# Patient Record
Sex: Female | Born: 1990 | Race: Black or African American | Hispanic: No | Marital: Single | Smoking: Never smoker
Health system: Southern US, Community
[De-identification: ages and names within clinical notes are randomized; demographics above are authoritative.]

## PROBLEM LIST (undated history)

## (undated) DIAGNOSIS — S060X9A Concussion with loss of consciousness of unspecified duration, initial encounter: Secondary | ICD-10-CM

## (undated) DIAGNOSIS — S060XAA Concussion with loss of consciousness status unknown, initial encounter: Secondary | ICD-10-CM

## (undated) DIAGNOSIS — R011 Cardiac murmur, unspecified: Secondary | ICD-10-CM

## (undated) HISTORY — PX: KNEE SURGERY: SHX244

---
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
## 2010-09-19 ENCOUNTER — Ambulatory Visit: Payer: Self-pay | Admitting: Family Medicine

## 2011-07-16 ENCOUNTER — Emergency Department (HOSPITAL_COMMUNITY)
Admission: EM | Admit: 2011-07-16 | Discharge: 2011-07-16 | Disposition: A | Discharge status: Home / Self Care | Payer: BC Managed Care – PPO | Attending: Emergency Medicine | Admitting: Emergency Medicine

## 2011-07-16 ENCOUNTER — Emergency Department (HOSPITAL_COMMUNITY): Payer: BC Managed Care – PPO

## 2011-07-16 DIAGNOSIS — X58XXXA Exposure to other specified factors, initial encounter: Secondary | ICD-10-CM | POA: Insufficient documentation

## 2011-07-16 DIAGNOSIS — M25539 Pain in unspecified wrist: Secondary | ICD-10-CM | POA: Insufficient documentation

## 2011-07-16 DIAGNOSIS — S63509A Unspecified sprain of unspecified wrist, initial encounter: Secondary | ICD-10-CM | POA: Insufficient documentation

## 2011-11-23 ENCOUNTER — Emergency Department: Payer: Self-pay | Admitting: Emergency Medicine

## 2012-10-09 ENCOUNTER — Ambulatory Visit: Payer: Self-pay | Admitting: General Practice

## 2013-01-21 ENCOUNTER — Telehealth (HOSPITAL_COMMUNITY): Payer: Self-pay | Admitting: Emergency Medicine

## 2013-01-21 ENCOUNTER — Encounter (HOSPITAL_COMMUNITY): Payer: Self-pay | Admitting: *Deleted

## 2013-01-21 ENCOUNTER — Emergency Department (HOSPITAL_COMMUNITY)
Admission: EM | Admit: 2013-01-21 | Discharge: 2013-01-21 | Disposition: A | Discharge status: Home / Self Care | Payer: BC Managed Care – PPO | Attending: Emergency Medicine | Admitting: Emergency Medicine

## 2013-01-21 DIAGNOSIS — R011 Cardiac murmur, unspecified: Secondary | ICD-10-CM | POA: Insufficient documentation

## 2013-01-21 DIAGNOSIS — R112 Nausea with vomiting, unspecified: Secondary | ICD-10-CM | POA: Insufficient documentation

## 2013-01-21 DIAGNOSIS — R197 Diarrhea, unspecified: Secondary | ICD-10-CM | POA: Insufficient documentation

## 2013-01-21 DIAGNOSIS — B9789 Other viral agents as the cause of diseases classified elsewhere: Secondary | ICD-10-CM | POA: Insufficient documentation

## 2013-01-21 DIAGNOSIS — Z87828 Personal history of other (healed) physical injury and trauma: Secondary | ICD-10-CM | POA: Insufficient documentation

## 2013-01-21 HISTORY — DX: Concussion with loss of consciousness status unknown, initial encounter: S06.0XAA

## 2013-01-21 HISTORY — DX: Concussion with loss of consciousness of unspecified duration, initial encounter: S06.0X9A

## 2013-01-21 HISTORY — DX: Cardiac murmur, unspecified: R01.1

## 2013-01-21 LAB — POCT I-STAT, CHEM 8
BUN: 15 mg/dL (ref 6–23)
Creatinine, Ser: 1 mg/dL (ref 0.50–1.10)
Hemoglobin: 15.6 g/dL — ABNORMAL HIGH (ref 12.0–15.0)
Potassium: 4.2 mEq/L (ref 3.5–5.1)
Sodium: 141 mEq/L (ref 135–145)

## 2013-01-21 MED ORDER — LOPERAMIDE HCL 2 MG PO CAPS
4.0000 mg | ORAL_CAPSULE | Freq: Once | ORAL | Status: AC
Start: 2013-01-21 — End: 2013-01-21
  Administered 2013-01-21: 4 mg via ORAL
  Filled 2013-01-21: qty 2

## 2013-01-21 MED ORDER — ONDANSETRON 8 MG PO TBDP: ORAL_TABLET | ORAL | Status: DC

## 2013-01-21 MED ORDER — ONDANSETRON HCL 4 MG/2ML IJ SOLN
4.0000 mg | Freq: Once | INTRAMUSCULAR | Status: AC
  Administered 2013-01-21: 4 mg via INTRAVENOUS
  Filled 2013-01-21: qty 2

## 2013-01-21 MED ORDER — SODIUM CHLORIDE 0.9 % IV BOLUS (SEPSIS)
1000.0000 mL | Freq: Once | INTRAVENOUS | Status: AC
  Administered 2013-01-21: 1000 mL via INTRAVENOUS

## 2013-01-21 MED ORDER — LOPERAMIDE HCL 2 MG PO CAPS: 2.0000 mg | ORAL_CAPSULE | Freq: Four times a day (QID) | ORAL | Status: DC | PRN

## 2013-01-21 NOTE — ED Notes (Signed)
Pt reports n/v/d that began yesterday evening approx 2000. Pt reports vomiting is constant and multiple episodes of watery diarrhea - pt denies any known fever or recent sick contacts.

## 2013-01-21 NOTE — ED Provider Notes (Signed)
Medical screening examination/treatment/procedure(s) were performed by non-physician practitioner and as supervising physician I was immediately available for consultation/collaboration.  John-Adam Gionni Freese, M.D.     John-Adam Shellee Streng, MD 01/21/13 0709 

## 2013-01-21 NOTE — ED Notes (Signed)
Pharmacy calling.  Rx for Ondansetron too expensive would like changed to something cheaper.  Will take chart to MD for review.

## 2013-01-21 NOTE — ED Provider Notes (Signed)
History     CSN: [id number]  Arrival date & time 01/21/13  0449   First MD Initiated Contact with Patient 01/21/13 0503      Chief Complaint  Patient presents with  . Nausea  . Emesis  . Diarrhea   HPI  History provided by the patient. Patient is a 22 year old female with no significant PMH who presents with complaints of acute onset nausea vomiting and diarrhea. Patient states that she thinks it may be from eating red meats earlier yesterday morning. In the past when she eats large meals with red meats she has stomach irritation with nausea. This evening however she developed acute onset nausea vomiting and diarrhea which has been much different than any previous experiences with red meats. She believes the meat was well cooked and not spoiled. She felt well during most of the day and was at the gym in the evening when she returned home around 8 PM her symptoms began. She reports multiple episodes of vomiting associated with watery diarrhea occuring at the same time. She denies having blood or mucus in stool. Denies any hematemesis. Symptoms have gradually improved with decreased frequency but she continues to have vomiting and diarrhea about once an hour for the past 4-5 hours. She has been trying to sip on fluids. She has not been able to eat anything else. She does work in a hospital setting. She does not know of any specific sick contacts. She denies fever, chills or sweats. Does report slight body aches. She is not use any treatments for symptoms. No other aggravating or alleviating factors. No other associated symptoms.    Past Medical History  Diagnosis Date  . Heart murmur   . Concussion     Past Surgical History  Procedure Laterality Date  . Knee surgery      History reviewed. No pertinent family history.  History  Substance Use Topics  . Smoking status: Never Smoker   . Smokeless tobacco: Not on file  . Alcohol Use: No    OB History   Grav Para Term Preterm  Abortions TAB SAB Ect Mult Living                  Review of Systems  HENT: Negative for congestion and rhinorrhea.   Respiratory: Negative for cough and shortness of breath.   Cardiovascular: Negative for chest pain.  Gastrointestinal: Positive for nausea. Negative for constipation and blood in stool.  Genitourinary: Negative for dysuria, frequency, hematuria, flank pain, vaginal bleeding and vaginal discharge.  All other systems reviewed and are negative.    Allergies  Codeine  Home Medications  No current outpatient prescriptions on file.  BP 103/61  Pulse 93  Temp(Src) 98.2 F (36.8 C) (Oral)  Resp 18  SpO2 99%  Physical Exam  Nursing note and vitals reviewed. Constitutional: She is oriented to person, place, and time. She appears well-developed and well-nourished. No distress.  HENT:  Head: Normocephalic.  Mouth/Throat: Oropharynx is clear and moist.  Neck: Normal range of motion. Neck supple.  No meningeal signs.  Cardiovascular: Normal rate and regular rhythm.   Pulmonary/Chest: Effort normal and breath sounds normal. No respiratory distress. She has no wheezes.  Abdominal: Soft. She exhibits no distension. There is no rebound, no guarding, no tenderness at McBurney's point and negative Murphy's sign.  Very mild diffuse tenderness  Neurological: She is alert and oriented to person, place, and time.  Skin: Skin is warm and dry. No rash noted.  Psychiatric: She  has a normal mood and affect. Her behavior is normal.    ED Course  Procedures   Results for orders placed during the hospital encounter of 01/21/13  POCT I-STAT, CHEM 8      Result Value Range   Sodium 141  135 - 145 mEq/L   Potassium 4.2  3.5 - 5.1 mEq/L   Chloride 110  96 - 112 mEq/L   BUN 15  6 - 23 mg/dL   Creatinine, Ser 1.61  0.50 - 1.10 mg/dL   Glucose, Bld 096 (*) 70 - 99 mg/dL   Calcium, Ion 0.45 (*) 1.12 - 1.23 mmol/L   TCO2 22  0 - 100 mmol/L   Hemoglobin 15.6 (*) 12.0 - 15.0 g/dL    HCT 40.9  81.1 - 91.4 %       1. Nausea vomiting and diarrhea   2. Viral syndrome       MDM  5:10AM Pt seen and evaluated.  Pt appears well in no acute distress.  She does not appear severely ill or toxic.          Angus Seller, PA-C 01/21/13 [phone]

## 2013-01-21 NOTE — ED Notes (Signed)
Pt given zofran d/t still feeling nauseous, was able to keep down water dranked.

## 2013-01-22 ENCOUNTER — Telehealth (HOSPITAL_COMMUNITY): Payer: Self-pay | Admitting: Emergency Medicine

## 2013-01-22 NOTE — ED Notes (Signed)
Chart reviewed by Arthor Captain PA " 25 mg phenergan, 1 tablet every 4 hr prn, # 20 no refills"  Rx called to [phone] 3/18 @ 1820.

## 2013-09-30 ENCOUNTER — Emergency Department (HOSPITAL_COMMUNITY)
Admission: EM | Admit: 2013-09-30 | Discharge: 2013-09-30 | Disposition: A | Discharge status: Home / Self Care | Payer: BC Managed Care – PPO | Attending: Emergency Medicine | Admitting: Emergency Medicine

## 2013-09-30 ENCOUNTER — Emergency Department (HOSPITAL_COMMUNITY): Payer: BC Managed Care – PPO

## 2013-09-30 DIAGNOSIS — R011 Cardiac murmur, unspecified: Secondary | ICD-10-CM | POA: Insufficient documentation

## 2013-09-30 DIAGNOSIS — S8990XA Unspecified injury of unspecified lower leg, initial encounter: Secondary | ICD-10-CM | POA: Insufficient documentation

## 2013-09-30 DIAGNOSIS — Y92410 Unspecified street and highway as the place of occurrence of the external cause: Secondary | ICD-10-CM | POA: Insufficient documentation

## 2013-09-30 DIAGNOSIS — Z9889 Other specified postprocedural states: Secondary | ICD-10-CM | POA: Insufficient documentation

## 2013-09-30 DIAGNOSIS — Y9389 Activity, other specified: Secondary | ICD-10-CM | POA: Insufficient documentation

## 2013-09-30 DIAGNOSIS — M25561 Pain in right knee: Secondary | ICD-10-CM

## 2013-09-30 DIAGNOSIS — Z87828 Personal history of other (healed) physical injury and trauma: Secondary | ICD-10-CM | POA: Insufficient documentation
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
## 2013-09-30 NOTE — ED Provider Notes (Signed)
Medical screening examination/treatment/procedure(s) were performed by non-physician practitioner and as supervising physician I was immediately available for consultation/collaboration.  EKG Interpretation   None        Shon Baton, MD 09/30/13 [phone]

## 2015-08-05 ENCOUNTER — Ambulatory Visit: Payer: Self-pay | Admitting: Physician Assistant

## 2015-08-05 ENCOUNTER — Encounter: Payer: Self-pay | Admitting: Physician Assistant

## 2015-08-05 VITALS — BP 98/66 | Temp 97.9°F

## 2015-08-05 DIAGNOSIS — R202 Paresthesia of skin: Secondary | ICD-10-CM
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
## 2015-08-20 ENCOUNTER — Emergency Department: Payer: BLUE CROSS/BLUE SHIELD

## 2015-08-20 ENCOUNTER — Encounter: Payer: Self-pay | Admitting: *Deleted

## 2015-08-20 ENCOUNTER — Emergency Department
Admission: EM | Admit: 2015-08-20 | Discharge: 2015-08-20 | Disposition: A | Discharge status: Home / Self Care | Payer: BLUE CROSS/BLUE SHIELD | Attending: Emergency Medicine | Admitting: Emergency Medicine

## 2015-08-20 DIAGNOSIS — Y9389 Activity, other specified: Secondary | ICD-10-CM | POA: Diagnosis not present

## 2015-08-20 DIAGNOSIS — Y92410 Unspecified street and highway as the place of occurrence of the external cause: Secondary | ICD-10-CM | POA: Insufficient documentation

## 2015-08-20 DIAGNOSIS — S299XXA Unspecified injury of thorax, initial encounter: Secondary | ICD-10-CM | POA: Diagnosis present

## 2015-08-20 DIAGNOSIS — Y998 Other external cause status: Secondary | ICD-10-CM | POA: Insufficient documentation

## 2015-08-20 DIAGNOSIS — Z79899 Other long term (current) drug therapy: Secondary | ICD-10-CM | POA: Diagnosis not present

## 2015-08-20 DIAGNOSIS — N3000 Acute cystitis without hematuria: Secondary | ICD-10-CM

## 2015-08-20 DIAGNOSIS — S29019A Strain of muscle and tendon of unspecified wall of thorax, initial encounter: Secondary | ICD-10-CM

## 2015-08-20 DIAGNOSIS — S29012A Strain of muscle and tendon of back wall of thorax, initial encounter: Secondary | ICD-10-CM | POA: Diagnosis not present

## 2015-08-20 LAB — URINALYSIS COMPLETE WITH MICROSCOPIC (ARMC ONLY)
Bilirubin Urine: NEGATIVE
GLUCOSE, UA: NEGATIVE mg/dL
HGB URINE DIPSTICK: NEGATIVE
Nitrite: NEGATIVE
PROTEIN: NEGATIVE mg/dL
SPECIFIC GRAVITY, URINE: 1.031 — AB (ref 1.005–1.030)
pH: 6 (ref 5.0–8.0)

## 2015-08-20 MED ORDER — CIPROFLOXACIN HCL 500 MG PO TABS: 500.0000 mg | ORAL_TABLET | Freq: Two times a day (BID) | ORAL | Status: DC

## 2015-08-20 MED ORDER — TRAMADOL HCL 50 MG PO TABS: 50.0000 mg | ORAL_TABLET | Freq: Four times a day (QID) | ORAL | Status: DC | PRN

## 2015-08-20 MED ORDER — NAPROXEN 500 MG PO TABS: 500.0000 mg | ORAL_TABLET | Freq: Two times a day (BID) | ORAL | Status: AC
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
## 2015-09-20 ENCOUNTER — Emergency Department
Admission: EM | Admit: 2015-09-20 | Discharge: 2015-09-20 | Disposition: A | Discharge status: Home / Self Care | Payer: BLUE CROSS/BLUE SHIELD | Attending: Emergency Medicine | Admitting: Emergency Medicine

## 2015-09-20 ENCOUNTER — Emergency Department: Payer: BLUE CROSS/BLUE SHIELD

## 2015-09-20 ENCOUNTER — Encounter: Payer: Self-pay | Admitting: Emergency Medicine

## 2015-09-20 DIAGNOSIS — Z791 Long term (current) use of non-steroidal anti-inflammatories (NSAID): Secondary | ICD-10-CM | POA: Diagnosis not present

## 2015-09-20 DIAGNOSIS — R1032 Left lower quadrant pain: Secondary | ICD-10-CM | POA: Insufficient documentation

## 2015-09-20 DIAGNOSIS — Z79899 Other long term (current) drug therapy: Secondary | ICD-10-CM | POA: Insufficient documentation

## 2015-09-20 DIAGNOSIS — R102 Pelvic and perineal pain: Secondary | ICD-10-CM | POA: Insufficient documentation

## 2015-09-20 DIAGNOSIS — R109 Unspecified abdominal pain: Secondary | ICD-10-CM | POA: Diagnosis present

## 2015-09-20 DIAGNOSIS — Z3202 Encounter for pregnancy test, result negative: Secondary | ICD-10-CM | POA: Insufficient documentation

## 2015-09-20 LAB — URINALYSIS COMPLETE WITH MICROSCOPIC (ARMC ONLY)
Bilirubin Urine: NEGATIVE
Glucose, UA: NEGATIVE mg/dL
Hgb urine dipstick: NEGATIVE
Ketones, ur: NEGATIVE mg/dL
Nitrite: NEGATIVE
PH: 5 (ref 5.0–8.0)
PROTEIN: NEGATIVE mg/dL
Specific Gravity, Urine: 1.023 (ref 1.005–1.030)

## 2015-09-20 LAB — POCT PREGNANCY, URINE: PREG TEST UR: NEGATIVE

## 2015-09-20 LAB — COMPREHENSIVE METABOLIC PANEL
ALK PHOS: 48 U/L (ref 38–126)
ALT: 13 U/L — AB (ref 14–54)
AST: 16 U/L (ref 15–41)
Albumin: 3.4 g/dL — ABNORMAL LOW (ref 3.5–5.0)
Anion gap: 4 — ABNORMAL LOW (ref 5–15)
BUN: 9 mg/dL (ref 6–20)
CALCIUM: 8.8 mg/dL — AB (ref 8.9–10.3)
CHLORIDE: 108 mmol/L (ref 101–111)
CO2: 24 mmol/L (ref 22–32)
CREATININE: 0.82 mg/dL (ref 0.44–1.00)
GFR calc Af Amer: >60 mL/min (ref >60)
Glucose, Bld: 93 mg/dL (ref 65–99)
Potassium: 4.1 mmol/L (ref 3.5–5.1)
SODIUM: 136 mmol/L (ref 135–145)
Total Bilirubin: 0.2 mg/dL — ABNORMAL LOW (ref 0.3–1.2)
Total Protein: 6.3 g/dL — ABNORMAL LOW (ref 6.5–8.1)

## 2015-09-20 LAB — CBC
HCT: 35.6 % (ref 35.0–47.0)
HEMOGLOBIN: 11.5 g/dL — AB (ref 12.0–16.0)
MCH: 23.8 pg — AB (ref 26.0–34.0)
MCHC: 32.3 g/dL (ref 32.0–36.0)
MCV: 73.6 fL — AB (ref 80.0–100.0)
PLATELETS: 236 10*3/uL (ref 150–440)
RBC: 4.83 MIL/uL (ref 3.80–5.20)
RDW: 13.7 % (ref 11.5–14.5)
WBC: 6.5 10*3/uL (ref 3.6–11.0)

## 2015-09-20 LAB — LIPASE, BLOOD: Lipase: 25 U/L (ref 11–51)

## 2015-09-20 MED ORDER — IOHEXOL 240 MG/ML SOLN
25.0000 mL | Freq: Once | INTRAMUSCULAR | Status: AC | PRN
  Administered 2015-09-20: 25 mL via ORAL

## 2015-09-20 MED ORDER — MORPHINE SULFATE (PF) 2 MG/ML IV SOLN
2.0000 mg | Freq: Once | INTRAVENOUS | Status: AC
  Administered 2015-09-20: 2 mg via INTRAVENOUS
  Filled 2015-09-20: qty 1

## 2015-09-20 MED ORDER — IOHEXOL 300 MG/ML  SOLN
100.0000 mL | Freq: Once | INTRAMUSCULAR | Status: AC | PRN
  Administered 2015-09-20: 100 mL via INTRAVENOUS

## 2015-09-20 MED ORDER — ONDANSETRON HCL 4 MG/2ML IJ SOLN
4.0000 mg | Freq: Once | INTRAMUSCULAR | Status: AC
  Administered 2015-09-20: 4 mg via INTRAVENOUS
  Filled 2015-09-20: qty 2

## 2015-09-20 MED ORDER — SODIUM CHLORIDE 0.9 % IV BOLUS (SEPSIS)
1000.0000 mL | Freq: Once | INTRAVENOUS | Status: AC
  Administered 2015-09-20: 1000 mL via INTRAVENOUS

## 2015-09-20 MED ORDER — CEPHALEXIN 500 MG PO CAPS: 500.0000 mg | ORAL_CAPSULE | Freq: Two times a day (BID) | ORAL | Status: DC

## 2015-09-20 MED ORDER — TRAMADOL HCL 50 MG PO TABS: 50.0000 mg | ORAL_TABLET | Freq: Four times a day (QID) | ORAL | Status: DC | PRN

## 2015-09-20 NOTE — ED Notes (Signed)
Patient transported to Ultrasound 

## 2015-09-20 NOTE — Discharge Instructions (Signed)

## 2015-09-20 NOTE — ED Notes (Addendum)
Patient transported to CT 

## 2015-09-20 NOTE — ED Notes (Signed)
Pt in via triage with complaints of RLQ abdominal pain since Thursday; constant dull pain but this morning it has been more of a "stabbing" pain. Pt reports difficulty urinating as well.  Pt A/Ox4, no acute distress at this time.

## 2015-09-20 NOTE — ED Provider Notes (Signed)
[facility] Emergency Department Provider Note  ____________________________________________  Time seen: On arrival  I have reviewed the triage vital signs and the nursing notes.   HISTORY  Chief Complaint Abdominal Pain    HPI Misty Hartman is a 24 y.o. female who presents with complaints of sharp abdominal pain.She reports the pain is primarily in her right lower quadrant and has become severe today. She reports it started 3 days ago and was somewhat diffuse at that time but now it has worsened and localized to the right lower quadrant. She has no history of kidney stones. She denies dysuria although she does report when she urinates but the pain in the right lower quadrant worse. She does not thinks feels like a UTI. No fevers no chills. No nausea no vomiting. No history of abdominal surgery     Past Medical History  Diagnosis Date  . Heart murmur   . Concussion     There are no active problems to display for this patient.   Past Surgical History  Procedure Laterality Date  . Knee surgery      Current Outpatient Rx  Name  Route  Sig  Dispense  Refill  . cephALEXin (KEFLEX) 500 MG capsule   Oral   Take 1 capsule (500 mg total) by mouth 2 (two) times daily.   14 capsule   0   . Multiple Vitamin (MULTIVITAMIN) tablet   Oral   Take 1 tablet by mouth daily. OTC         . naproxen (NAPROSYN) 500 MG tablet   Oral   Take 1 tablet (500 mg total) by mouth 2 (two) times daily with a meal.   60 tablet   2   . Psyllium ([facility] NATURAL FIBER SUPPLEMENT PO)   Oral   Take 1 capsule by mouth daily.         . traMADol (ULTRAM) 50 MG tablet   Oral   Take 1 tablet (50 mg total) by mouth every 6 (six) hours as needed.   20 tablet   0     Allergies Codeine  No family history on file.  Social History Social History  Substance Use Topics  . Smoking status: Never Smoker   . Smokeless tobacco: None  . Alcohol Use: No    Review of  Systems  Constitutional: Negative for fever. Eyes: Negative for visual changes. ENT: Negative for sore throat Cardiovascular: Negative for chest pain. Respiratory: Negative for shortness of breath. Gastrointestinal: Positive for abdominal pain as above Genitourinary: Negative for dysuria. Musculoskeletal: Negative for back pain. Skin: Negative for rash. Neurological: Negative for headaches  Psychiatric: No anxiety    ____________________________________________   PHYSICAL EXAM:  VITAL SIGNS: ED Triage Vitals  Enc Vitals Group     BP 09/20/15 0716 116/62 mmHg     Pulse Rate 09/20/15 0716 65     Resp 09/20/15 0716 16     Temp 09/20/15 0716 98.3 F (36.8 C)     Temp Source 09/20/15 0716 Oral     SpO2 09/20/15 0716 100 %     Weight 09/20/15 0716 175 lb (79.379 kg)     Height 09/20/15 0716 5\' 7"  (1.702 m)     Head Cir --      Peak Flow --      Pain Score 09/20/15 0718 7     Pain Loc --      Pain Edu? --      Excl. in GC? --  Constitutional: Alert and oriented. Well appearing and in no distress. Pleasant and interactive Eyes: Conjunctivae are normal.  ENT   Head: Normocephalic and atraumatic.   Mouth/Throat: Mucous membranes are moist. Cardiovascular: Normal rate, regular rhythm. Normal and symmetric distal pulses are present in all extremities. No murmurs, rubs, or gallops. Respiratory: Normal respiratory effort without tachypnea nor retractions. Breath sounds are clear and equal bilaterally.  Gastrointestinal: Patient with significant right lower quadrant tenderness to palpation. No distention. There is no CVA tenderness. Genitourinary: deferred Musculoskeletal: Nontender with normal range of motion in all extremities. No lower extremity tenderness nor edema. Neurologic:  Normal speech and language. No gross focal neurologic deficits are appreciated. Skin:  Skin is warm, dry and intact. No rash noted. Psychiatric: Mood and affect are normal. Patient  exhibits appropriate insight and judgment.  ____________________________________________    LABS (pertinent positives/negatives)  Labs Reviewed  CBC - Abnormal; Notable for the following:    Hemoglobin 11.5 (*)    MCV 73.6 (*)    MCH 23.8 (*)    All other components within normal limits  COMPREHENSIVE METABOLIC PANEL - Abnormal; Notable for the following:    Calcium 8.8 (*)    Total Protein 6.3 (*)    Albumin 3.4 (*)    ALT 13 (*)    Total Bilirubin 0.2 (*)    Anion gap 4 (*)    All other components within normal limits  URINALYSIS COMPLETEWITH MICROSCOPIC ([facility] ONLY) - Abnormal; Notable for the following:    Color, Urine YELLOW (*)    APPearance CLOUDY (*)    Leukocytes, UA 2+ (*)    Bacteria, UA RARE (*)    Squamous Epithelial / LPF 6-30 (*)    All other components within normal limits  LIPASE, BLOOD  POCT PREGNANCY, URINE    ____________________________________________   EKG  None  ____________________________________________    RADIOLOGY I have personally reviewed any xrays that were ordered on this patient: CT abdomen pelvis is unremarkable Ultrasound pelvis is unremarkable  ____________________________________________   PROCEDURES  Procedure(s) performed: none  Critical Care performed: none  ____________________________________________   INITIAL IMPRESSION / ASSESSMENT AND PLAN / ED COURSE  Pertinent labs & imaging results that were available during my care of the patient were reviewed by me and considered in my medical decision making (see chart for details).  Patient presents with abdominal pain that is now localized to the right lower quadrant suspicious for appendicitis. Differential includes urinary tract infection, kidney stone, colitis, ovarian cyst. We will obtain labs, give IV morphine and IV Zofran and will likely require CT scan of the abdomen and pelvis to further evaluate  After Extensive workup no abnormalities found. Patient  claims to have some mild discomfort she does not want me to do a pelvic exam in the emergency department. She will follow-up with her PCP and return if any worsening of her pain. We'll treat her urinary tract infection with Keflex  ____________________________________________   FINAL CLINICAL IMPRESSION(S) / ED DIAGNOSES  Final diagnoses:  Pelvic pain in female  Abdominal pain, acute, left lower quadrant     Jene Every, MD 09/20/15 1351

## 2015-09-20 NOTE — ED Notes (Signed)
Right lower quadrant abdominal pain began Thursday, worse this AM, describes "stabbing" pain. "Uncomfortable to urinate", nauseous starting on Sat., no emesis.  Cannot remember last BM. LMP - 10/18

## 2015-10-06 ENCOUNTER — Ambulatory Visit: Payer: Self-pay | Admitting: Physician Assistant

## 2015-10-06 ENCOUNTER — Encounter: Payer: Self-pay | Admitting: Physician Assistant

## 2015-10-06 VITALS — BP 100/70 | HR 60 | Temp 97.7°F

## 2015-10-06 DIAGNOSIS — N390 Urinary tract infection, site not specified: Secondary | ICD-10-CM

## 2015-10-06 DIAGNOSIS — R300 Dysuria: Secondary | ICD-10-CM

## 2015-10-06 LAB — POCT URINALYSIS DIPSTICK
Bilirubin, UA: NEGATIVE
Blood, UA: NEGATIVE
Glucose, UA: NEGATIVE
Ketones, UA: NEGATIVE
Nitrite, UA: NEGATIVE
PH UA: 5.5
PROTEIN UA: NEGATIVE
SPEC GRAV UA: 1.02
UROBILINOGEN UA: 0.2

## 2015-10-06 MED ORDER — FLUCONAZOLE 150 MG PO TABS: ORAL_TABLET | ORAL | Status: DC

## 2015-10-06 MED ORDER — SULFAMETHOXAZOLE-TRIMETHOPRIM 800-160 MG PO TABS: 1.0000 | ORAL_TABLET | Freq: Two times a day (BID) | ORAL | Status: DC

## 2015-10-06 NOTE — Progress Notes (Signed)
S: c/o flank pain and burning with urination, was seen in ER 2 weeks ago and had pelvic US, ever since then the vaginal tissue has been really inflamed, ?if allergic reaction to ky or condom used on probe, has never used condoms as does not have sex with men, no fever/chills, leuks were 2+ in ER, one month prior leuks were +trace  O: vitals wnl, nad, slight cva tenderness on r, abd soft a little tender in rlq, bs normal all 4 quads, ua + trace leuks  A: uti, ?kidney stone as ct in ER was done with contrast and this would obscure a stone, ?reaction to latex or ky vs yeast in vaginal area  P: septra ds 1 po bid, diflucan 150mg  1 now and 1 in 1 week, drink plenty of water, stop drinking sodas, return the day after you take the last pill for uti for recheck

## 2015-10-14 ENCOUNTER — Encounter: Payer: Self-pay | Admitting: Physician Assistant

## 2015-10-14 ENCOUNTER — Ambulatory Visit: Payer: Self-pay | Admitting: Physician Assistant

## 2015-10-14 VITALS — BP 100/70 | HR 80

## 2015-10-14 DIAGNOSIS — R1030 Lower abdominal pain, unspecified: Secondary | ICD-10-CM

## 2015-10-14 DIAGNOSIS — R300 Dysuria: Secondary | ICD-10-CM

## 2015-10-14 LAB — POCT URINALYSIS DIPSTICK
BILIRUBIN UA: NEGATIVE
Blood, UA: NEGATIVE
GLUCOSE UA: NEGATIVE
Ketones, UA: NEGATIVE
LEUKOCYTES UA: NEGATIVE
NITRITE UA: NEGATIVE
PH UA: 6
Protein, UA: NEGATIVE
Spec Grav, UA: >=1.03
Urobilinogen, UA: 0.2

## 2015-10-14 NOTE — Progress Notes (Signed)
S: here for recheck of urine, states still has an odor , last week still had a lot of flank pain but its improved, no vag discharge, no fever/chills  O: vitals wnl, nad, abd soft nontender bs normal, ua wnl  A: resolving abdominal pain, and uti  P: reassurance, signs and sx of appendicitis, kidney stone, and ruptured ovarian cyst discussed, if odor in  Urine persists see gyn, can refer to urology if need for ?stone

## 2015-11-04 ENCOUNTER — Encounter: Payer: Self-pay | Admitting: Physician Assistant

## 2015-11-04 ENCOUNTER — Ambulatory Visit: Payer: Self-pay | Admitting: Physician Assistant

## 2015-11-04 VITALS — BP 100/80 | HR 64

## 2015-11-04 DIAGNOSIS — R300 Dysuria: Secondary | ICD-10-CM

## 2015-11-04 DIAGNOSIS — R1031 Right lower quadrant pain: Secondary | ICD-10-CM

## 2015-11-04 DIAGNOSIS — G8929 Other chronic pain: Secondary | ICD-10-CM

## 2015-11-04 LAB — POCT URINALYSIS DIPSTICK
BILIRUBIN UA: NEGATIVE
GLUCOSE UA: NEGATIVE
Ketones, UA: NEGATIVE
Leukocytes, UA: NEGATIVE
Nitrite, UA: NEGATIVE
Protein, UA: NEGATIVE
RBC UA: NEGATIVE
SPEC GRAV UA: 1.025
Urobilinogen, UA: 1
pH, UA: 5.5

## 2015-11-04 NOTE — Progress Notes (Signed)
S: c/o odor in urine and r lower back pain, is mid cycle, hx of ovarian cyst, same sx that were evaluated in ER and told it was a cyst or stone; no fever/chills or vag d/c  O: vitals wnl, nad, ua wnl, abd soft mildly tender rlq  A: chronic rlq pain, ?ovarian cyst  P: f/u with gyn

## 2016-02-04 ENCOUNTER — Ambulatory Visit: Payer: Self-pay | Admitting: Physician Assistant

## 2016-02-04 ENCOUNTER — Encounter: Payer: Self-pay | Admitting: Physician Assistant

## 2016-02-04 VITALS — BP 100/80 | HR 78 | Temp 98.5°F

## 2016-02-04 DIAGNOSIS — J111 Influenza due to unidentified influenza virus with other respiratory manifestations: Secondary | ICD-10-CM

## 2016-02-04 MED ORDER — METHYLPREDNISOLONE 4 MG PO TBPK: ORAL_TABLET | ORAL | Status: AC

## 2016-02-04 MED ORDER — BENZONATATE 200 MG PO CAPS: 200.0000 mg | ORAL_CAPSULE | Freq: Two times a day (BID) | ORAL | Status: AC | PRN

## 2016-02-04 NOTE — Progress Notes (Signed)
S: C/o runny nose and congestion with dry cough for 6 days, + fever, chills, body aches;  denies cp/sob, v/d; mucus was green this am but clear throughout the day, cough is sporadic,   Using otc meds: robitussin  O: PE: vitals wnl, nad,  perrl eomi, normocephalic, tms dull, nasal mucosa red and swollen, throat injected, neck supple no lymph, lungs c t a, cv rrr, neuro intact,   A:  Acute flu like illness   P: medrol dose pack, tessalon, explained to patient its too late for tamiflu and flu swab probably wouldn't be accurate since she's 6 days out; drink fluids, continue regular meds , use otc meds of choice, return if not improving in 5 days, return earlier if worsening

## 2016-04-03 IMAGING — CT CT ABD-PELV W/ CM
1 of 2 series · 15 of 32 positions shown, 19 images · IV contrast (omnipaque)
Comparison: Plain films of 10/09/2012.  No prior CT.

CLINICAL DATA: Right lower quadrant pain since [REDACTED].  Dysuria.

EXAM:
CT ABDOMEN AND PELVIS WITH CONTRAST
TECHNIQUE: Multidetector CT imaging of the abdomen and pelvis was performed
using the standard protocol following bolus administration of
intravenous contrast.
CONTRAST:  100mL OMNIPAQUE IOHEXOL 300 MG/ML  SOLN

[Series 2: routine abd pel with · axial · 0.71mm/px · z∈[-496,-66]mm · 15 of 94 slices shown, 19 images]
[im 4/94  soft-tissue]
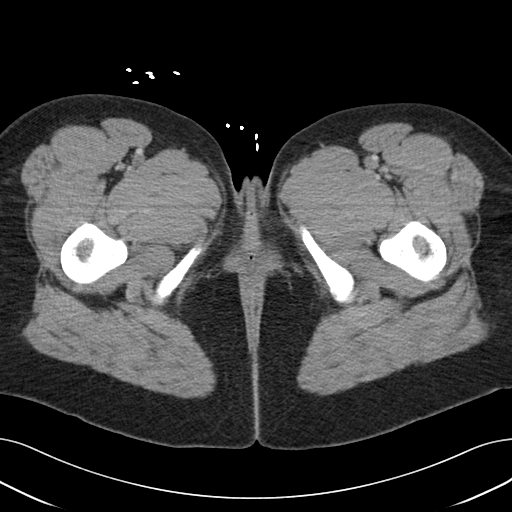
[im 4/94  bone]
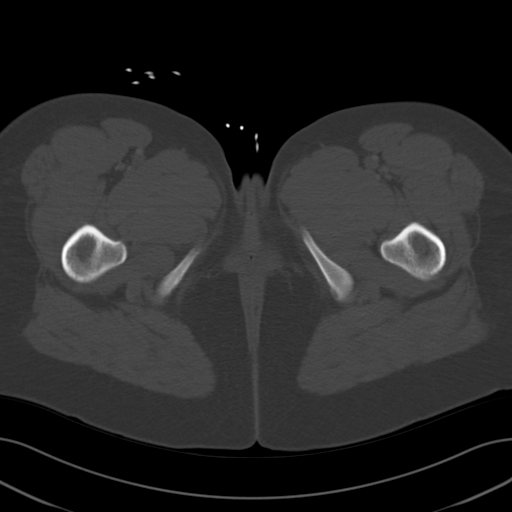
[im 12/94  soft-tissue]
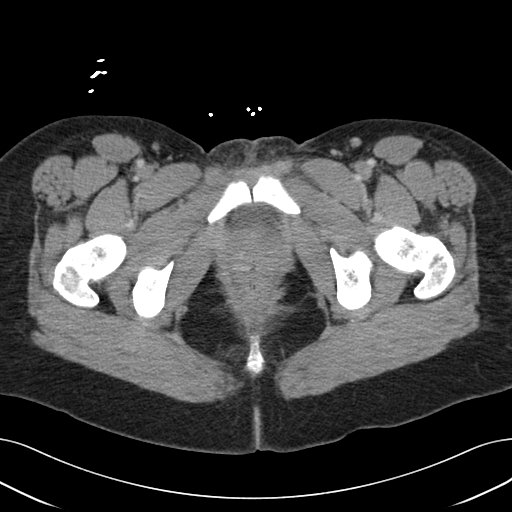
[im 19/94  soft-tissue]
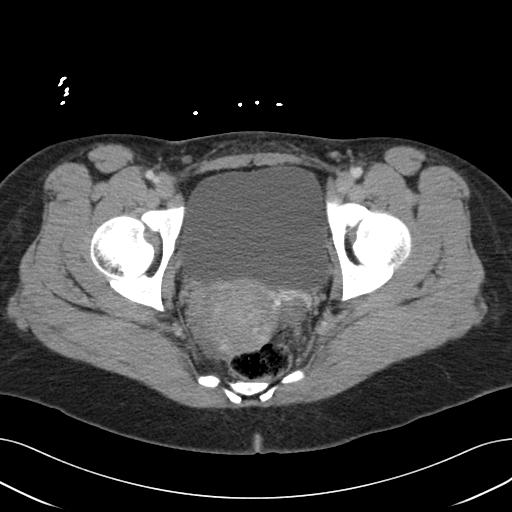
[im 27/94  soft-tissue]
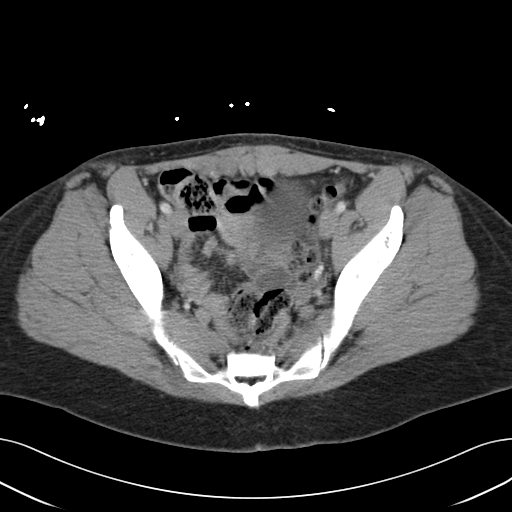
[im 34/94  soft-tissue]
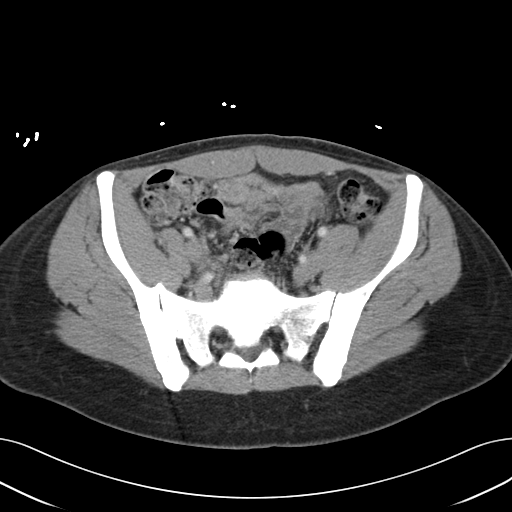
[im 41/94  soft-tissue]
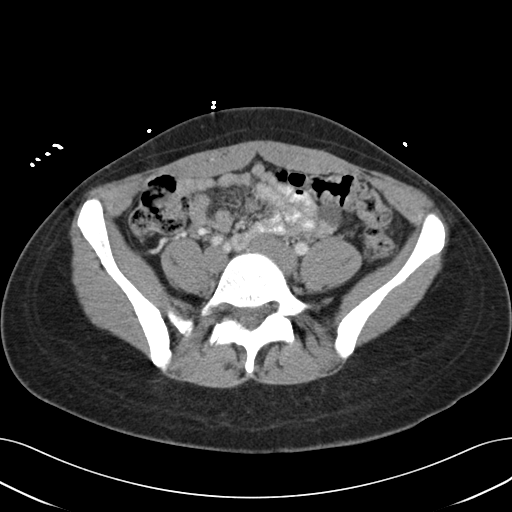
[im 49/94  soft-tissue]
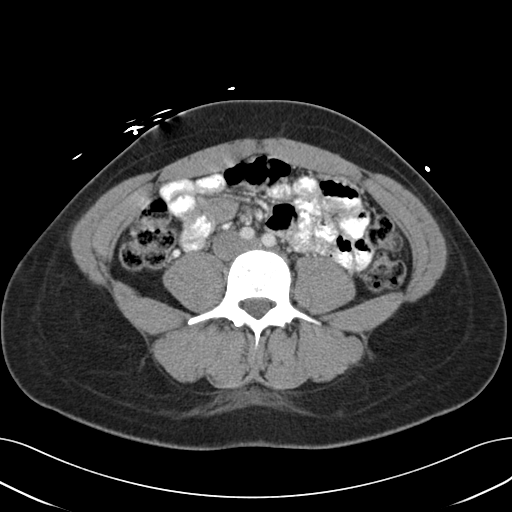
[im 53/94  soft-tissue]
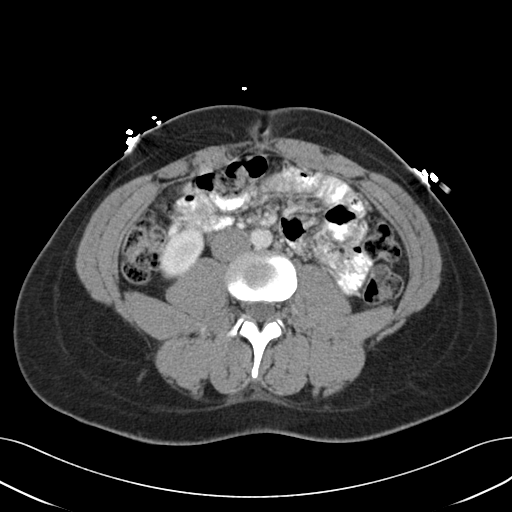
[im 60/94  soft-tissue]
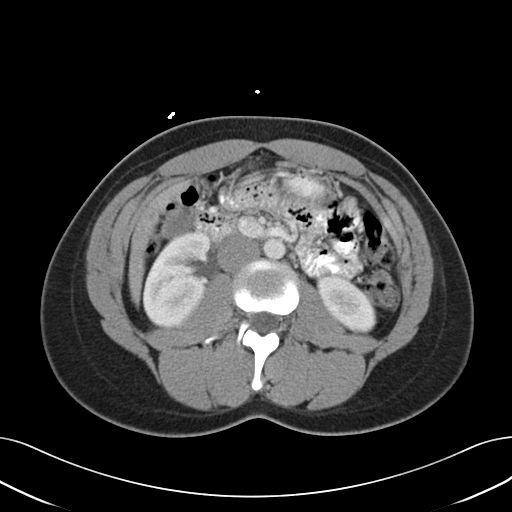
[im 60/94  bone]
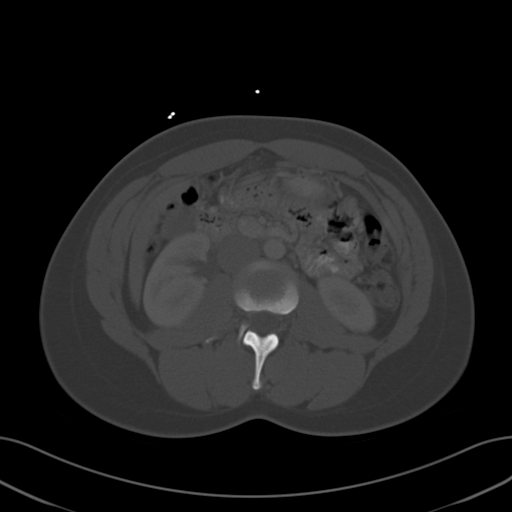
[im 67/94  soft-tissue]
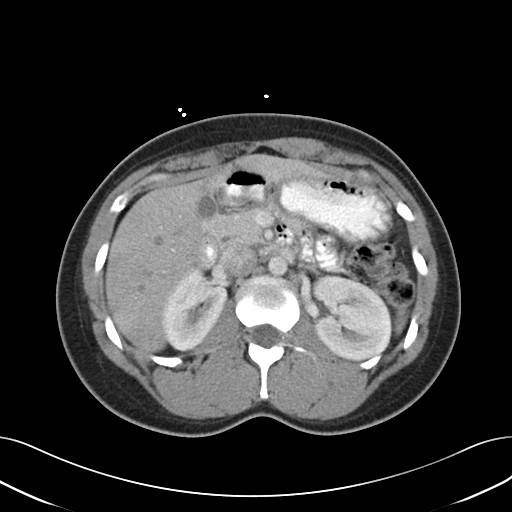
[im 75/94  soft-tissue]
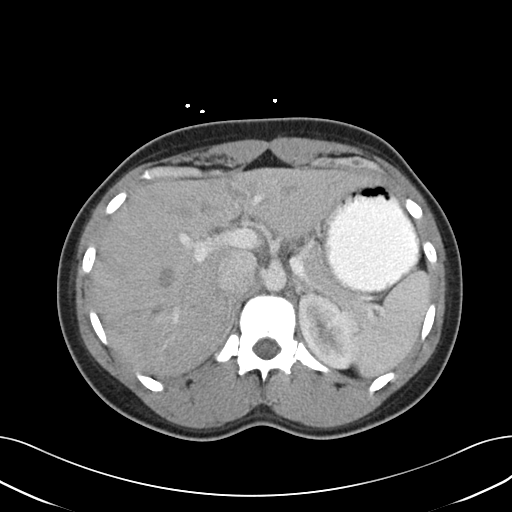
[im 79/94  lung]
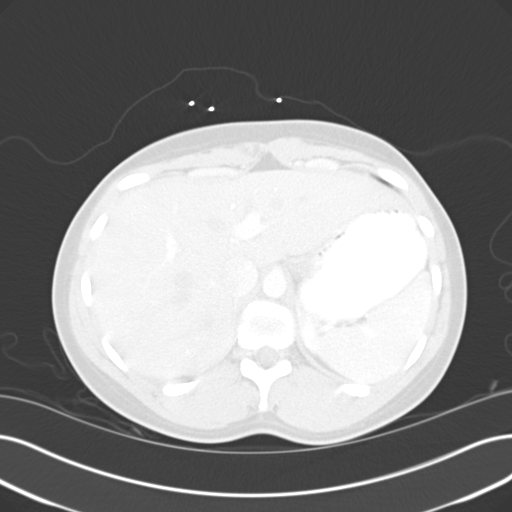
[im 82/94  soft-tissue]
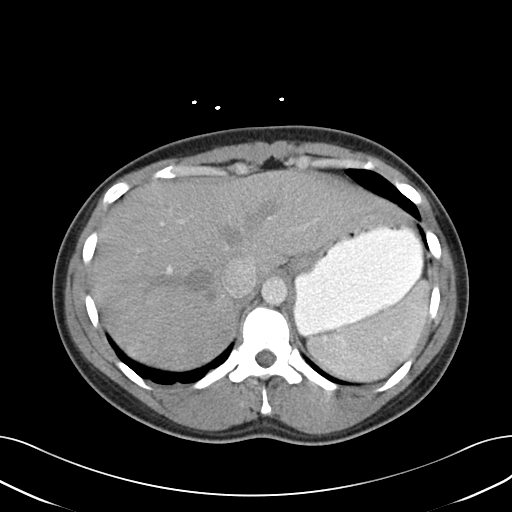
[im 82/94  lung]
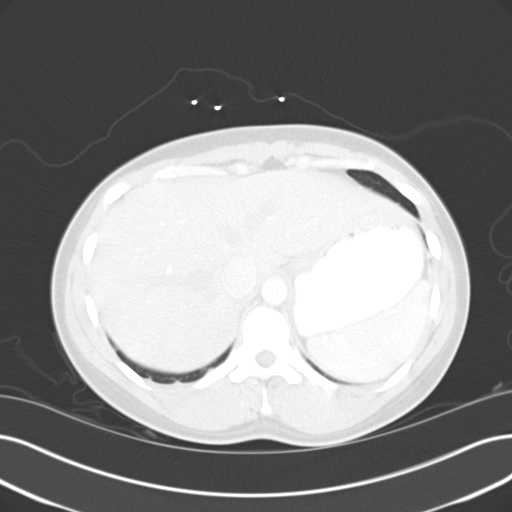
[im 86/94  lung]
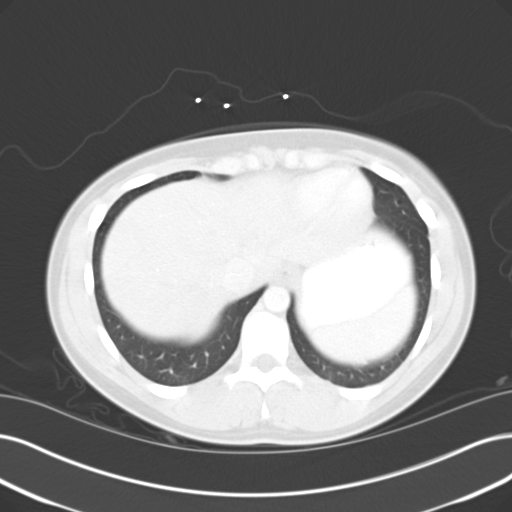
[im 90/94  soft-tissue]
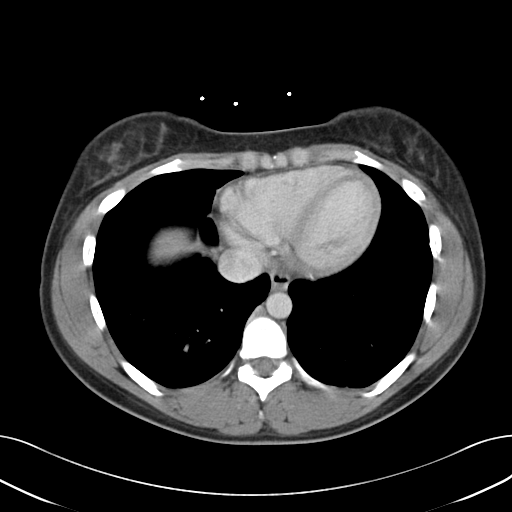
[im 90/94  lung]
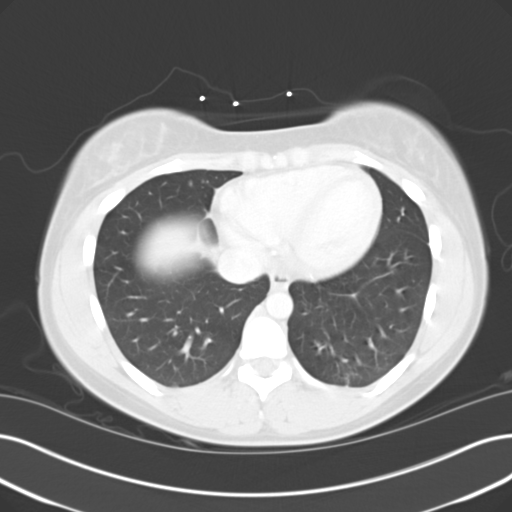

[15 of 32 positions shown; findings below may reference images not displayed]

FINDINGS: Lower chest: Clear lung bases. Normal heart size without pericardial
or pleural effusion.

Hepatobiliary: Normal liver. Normal gallbladder, without biliary
ductal dilatation.

Pancreas: Normal, without mass or ductal dilatation.

Spleen: Normal in size, without focal abnormality.

Adrenals/Urinary Tract: Normal adrenal glands. Normal kidneys,
without hydronephrosis. Normal urinary bladder.

Stomach/Bowel: Normal stomach, without wall thickening. Normal colon
and terminal ileum. The appendix is suboptimally visualized. The
imaged portions are normal (image 62, series 2). No right lower
quadrant inflammation. Normal small bowel.

Vascular/Lymphatic: Normal caliber of the aorta and branch vessels.
No abdominopelvic adenopathy.

Reproductive: Retroverted uterus. A left ovarian corpus luteal cyst
measures 1.8 cm (image 74, series 2).

Other: Trace free pelvic fluid is likely physiologic.

Musculoskeletal: Presumably degenerative sclerosis of the right
sacroiliac joint.
IMPRESSION: 1. Left ovarian corpus luteal cyst. Cul-de-sac fluid is likely
physiologic, but could relate to recent cyst rupture.
2. Normal imaged portions of the appendix, without other explanation
for right lower quadrant pain.

## 2016-04-18 ENCOUNTER — Encounter: Payer: Self-pay | Admitting: Physician Assistant

## 2016-04-18 ENCOUNTER — Ambulatory Visit: Payer: Self-pay | Admitting: Physician Assistant

## 2016-04-18 VITALS — BP 90/70 | HR 60 | Temp 98.6°F

## 2016-04-18 DIAGNOSIS — J018 Other acute sinusitis: Secondary | ICD-10-CM

## 2016-04-18 MED ORDER — AMOXICILLIN 875 MG PO TABS: 875.0000 mg | ORAL_TABLET | Freq: Two times a day (BID) | ORAL | Status: AC

## 2016-04-18 NOTE — Progress Notes (Signed)
S: C/o sore throat, ear pain,  and congestion for 3 days, ? fever,  Denies chills, cp/sob, v/d; mucus is bloody and thick, cough is sporadic, c/o of facial and dental pain.   Using otc meds:   O: PE: vitals wnl, nad, perrl eomi, normocephalic, tms dull, nasal mucosa red and swollen, throat injected, neck supple no lymph, lungs c t a, cv rrr, neuro intact  A:  Acute sinusitis   P: drink fluids, continue regular meds , use otc meds of choice, return if not improving in 5 days, return earlier if worsening, amoxil 875mg  bid

## 2016-05-07 IMAGING — US US PELVIS COMPLETE
1 series · 13 of 25 positions shown · non-contrast
Comparison: CT of earlier today

CLINICAL DATA: Right lower quadrant pain.

EXAM:
TRANSABDOMINAL AND TRANSVAGINAL ULTRASOUND OF PELVIS
DOPPLER ULTRASOUND OF OVARIES
TECHNIQUE: Both transabdominal and transvaginal ultrasound examinations of the
pelvis were performed. Transabdominal technique was performed for
global imaging of the pelvis including uterus, ovaries, adnexal
regions, and pelvic cul-de-sac.
It was necessary to proceed with endovaginal exam following the
transabdominal exam to visualize the uterus, ovaries, and adnexa.
Color and duplex Doppler ultrasound was utilized to evaluate blood
flow to the ovaries.

[Series 1: us pelvis complete · 0.24mm/px · 13 of 80 slices shown]
[im 1/80]
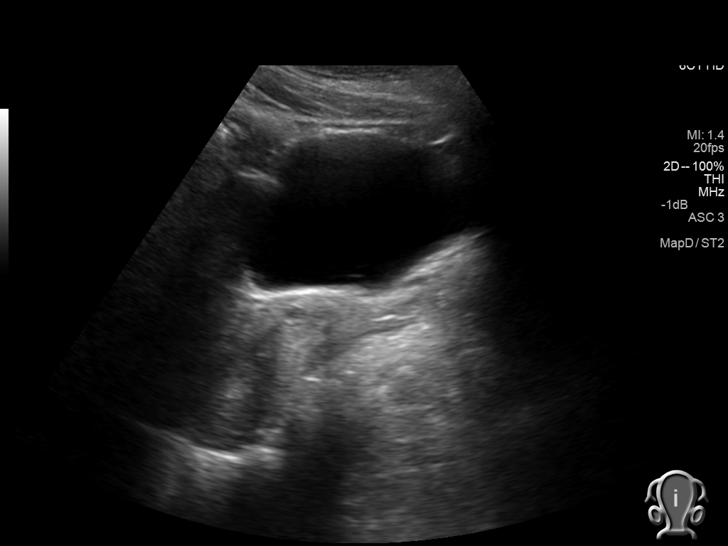
[im 7/80]
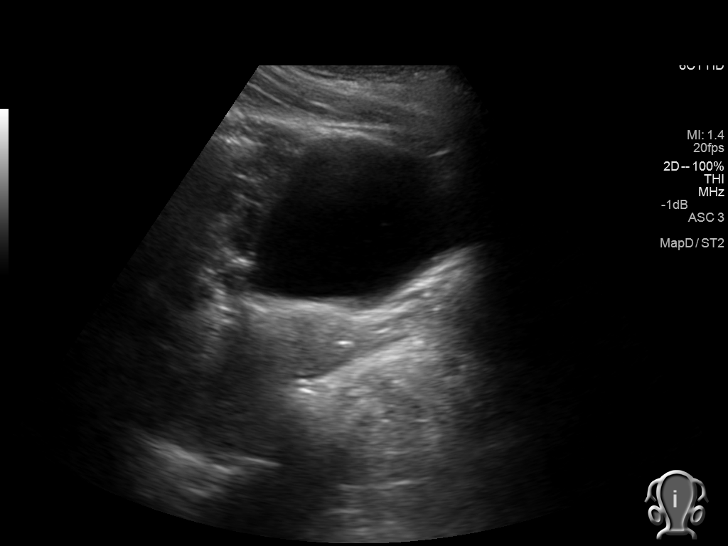
[im 14/80]
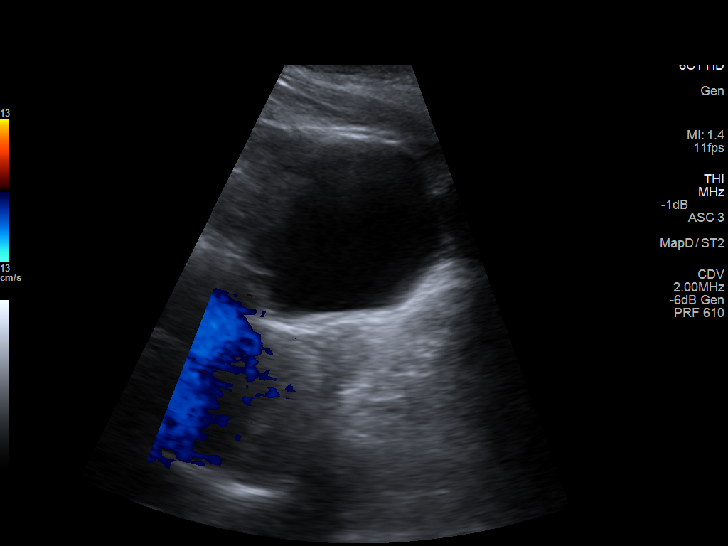
[im 20/80]
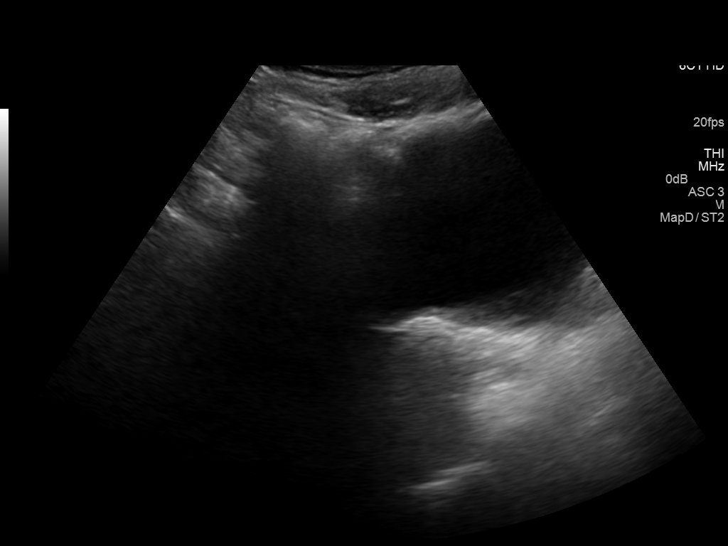
[im 27/80]
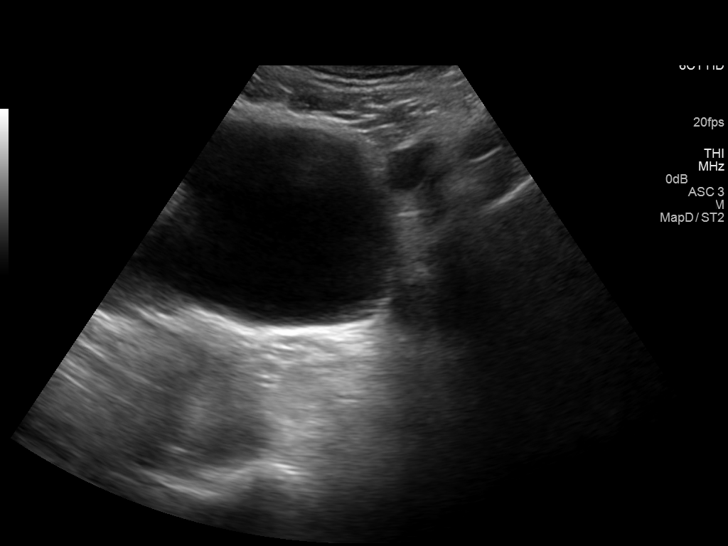
[im 33/80]
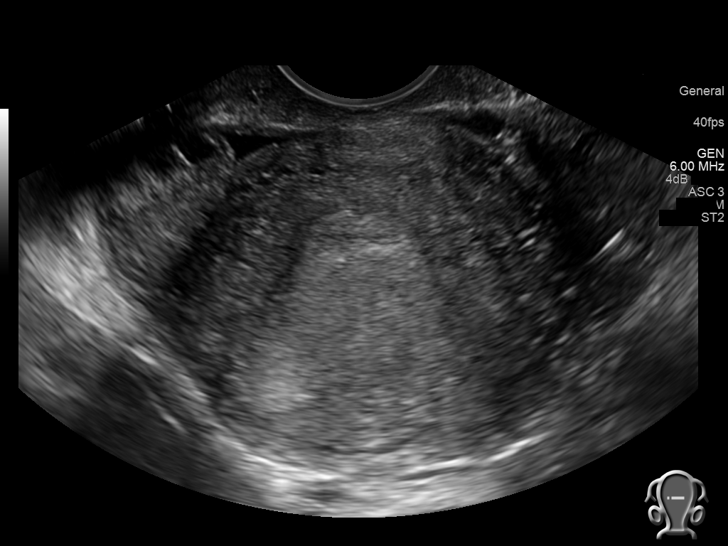
[im 40/80]
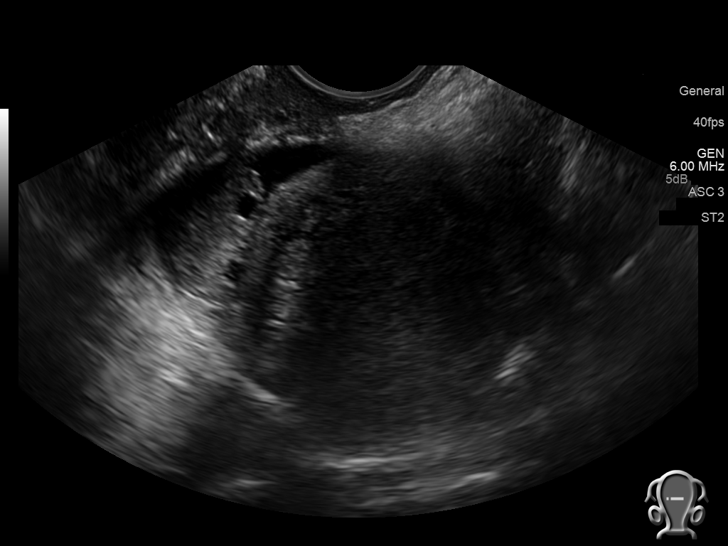
[im 47/80]
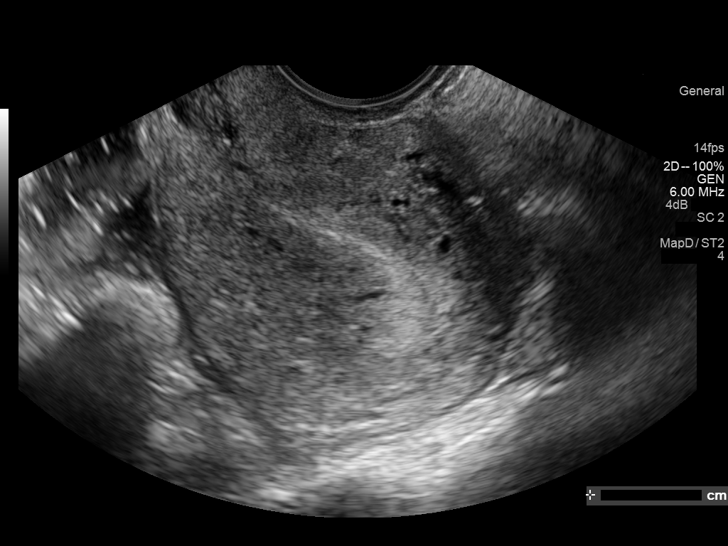
[im 53/80]
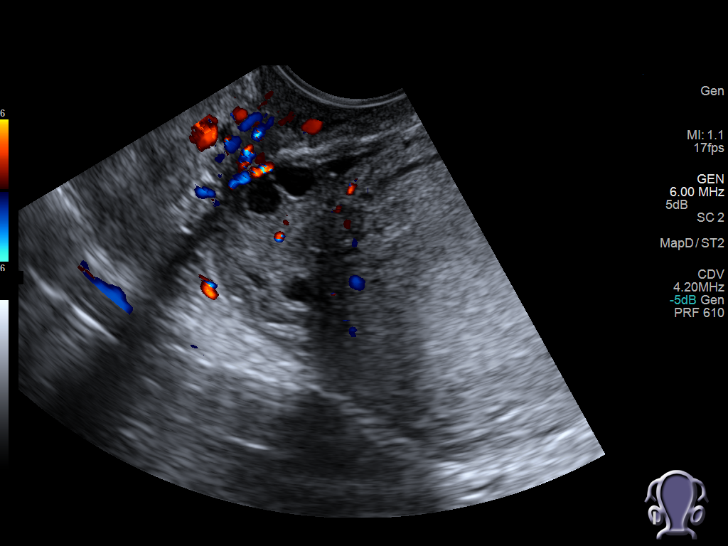
[im 60/80]
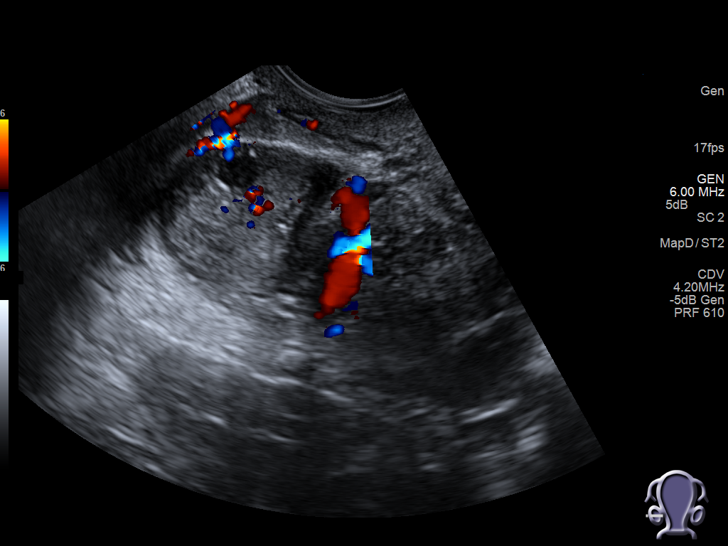
[im 66/80]
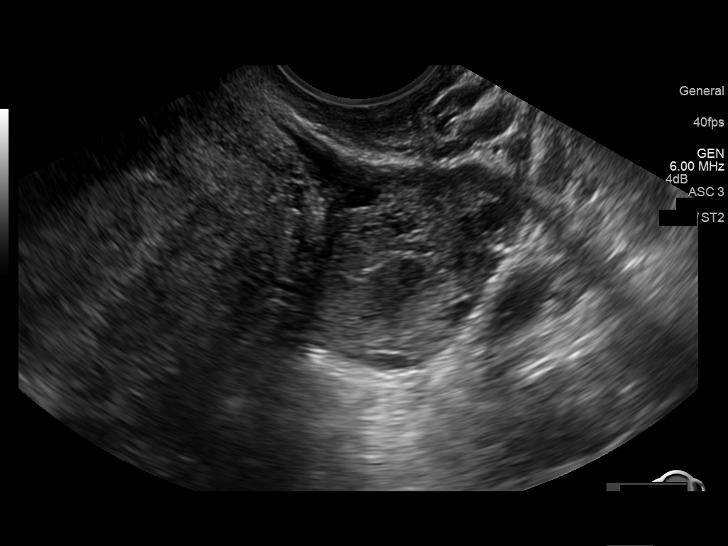
[im 73/80]
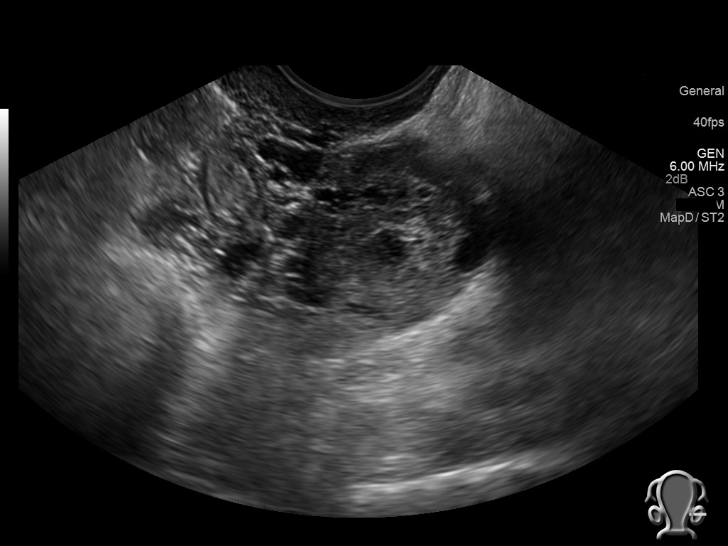
[im 80/80]
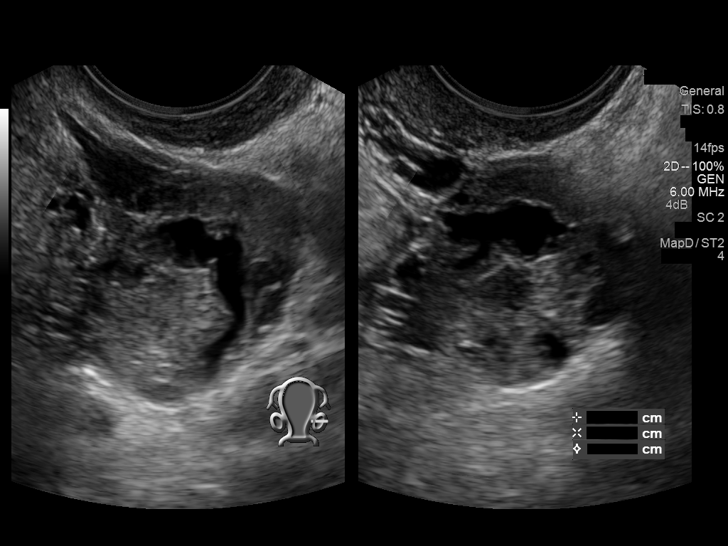

[13 of 25 positions shown; findings below may reference images not displayed]

FINDINGS: Uterus

Measurements: 7.3 x 3.3 x 5.7 cm. Retroverted. Otherwise normal in
morphology.

Endometrium

Thickness: Normal, 14 mm..  No focal abnormality visualized.

Right ovary

Measurements: 2.6 x 1.7 x 2.0 cm. Normal appearance/no adnexal mass.

Left ovary

Measurements: 3.3 x 2.4 x 2.7 cm. 2.0 x 2.3 x 2.4 cm corpus luteal
cyst identified.

Pulsed Doppler evaluation of both ovaries demonstrates normal
low-resistance arterial and venous waveforms.

Other findings

Small volume cul-de-sac fluid.
IMPRESSION: 1. No evidence of ovarian or adnexal torsion.
2. Left ovarian corpus luteal cyst, as on CT.
3. Small volume cul-de-sac fluid could be physiologic or relate to
recent cyst rupture.
